# Patient Record
Sex: Female | Born: 1995 | Race: White | Hispanic: No | Marital: Married | Smoking: Never smoker
Health system: Southern US, Community
[De-identification: ages and names within clinical notes are randomized; demographics above are authoritative.]

---
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
## 2019-10-05 ENCOUNTER — Other Ambulatory Visit: Payer: Self-pay

## 2019-10-05 ENCOUNTER — Encounter: Payer: Self-pay | Admitting: Physician Assistant

## 2019-10-05 ENCOUNTER — Ambulatory Visit: Payer: BC Managed Care – PPO | Admitting: Physician Assistant

## 2019-10-05 DIAGNOSIS — L720 Epidermal cyst: Secondary | ICD-10-CM | POA: Diagnosis not present

## 2019-10-05 MED ORDER — SULFAMETHOXAZOLE-TRIMETHOPRIM 800-160 MG PO TABS: 1.0000 | ORAL_TABLET | Freq: Two times a day (BID) | ORAL | 0 refills | Status: AC

## 2019-10-05 NOTE — Progress Notes (Signed)
Noticed cyst under skin for 2 weeks but it has only been swollen for 3 days.

## 2019-10-05 NOTE — Progress Notes (Signed)
   Follow up Visit  Subjective  Erika Waters is a 24 y.o. female who presents for the following: Skin Problem (Bump on nose/near eye. Very painful and swollen. Has gone down some but has been there for 2 week..Treatment aczone had some leftover from previous visit. ). She could feel it under the skin for the past 2 weeks but it only became sore in the past 3 days.   Objective  Well appearing patient in no apparent distress; mood and affect are within normal limits.  Face examined. Relevant physical exam findings are noted in the Assessment and Plan.   Objective  Left Malar Cheek: Inflamed nodule left side of nose near medial canthal area  Assessment & Plan  Epidermal cyst Left Malar Cheek  Discussed possible need to lance or inject this area but we will see what antibiotics an do to calm it first. She is to call or send a message on Monday with a progress report.   sulfamethoxazole-trimethoprim (BACTRIM DS) 800-160 MG tablet - Left Malar Cheek

## 2019-10-05 NOTE — Patient Instructions (Signed)
Call with report on Monday.

## 2019-12-14 ENCOUNTER — Ambulatory Visit: Payer: BC Managed Care – PPO | Admitting: Dermatology

## 2020-01-04 ENCOUNTER — Other Ambulatory Visit: Payer: Self-pay

## 2020-01-04 ENCOUNTER — Ambulatory Visit (INDEPENDENT_AMBULATORY_CARE_PROVIDER_SITE_OTHER): Payer: BC Managed Care – PPO | Admitting: Dermatology

## 2020-01-04 ENCOUNTER — Encounter: Payer: Self-pay | Admitting: Dermatology

## 2020-01-04 DIAGNOSIS — L729 Follicular cyst of the skin and subcutaneous tissue, unspecified: Secondary | ICD-10-CM | POA: Diagnosis not present

## 2020-01-04 DIAGNOSIS — Z1283 Encounter for screening for malignant neoplasm of skin: Secondary | ICD-10-CM

## 2020-01-08 ENCOUNTER — Encounter: Payer: Self-pay | Admitting: Dermatology

## 2020-01-08 NOTE — Progress Notes (Signed)
   Follow-Up Visit   Subjective  Erika Waters is a 24 y.o. female who presents for the following: Annual Exam (Face between eyes ? Cyst- going away now).  Cyst Location: Mid brow area forehead Duration:  Quality:  Associated Signs/Symptoms: Modifying Factors:  Severity:  Timing: Context:   Objective  Well appearing patient in no apparent distress; mood and affect are within normal limits. Objective  Chest - Medial [facility]): Sun exposed areas plus upper chest and back examined; no atypical moles, melanoma, or nonmole skin cancer  Objective  Left Root of Nose: Subtle fibrotic 8 mm fullness left root of the nose.  History of 2 bouts of inflammation.   All sun exposed areas plus back examined.  Plus upper chest   Assessment & Plan    Screening exam for skin cancer Chest - Medial (Center)  UV protection.  Self examine her skin twice annually.  Cyst of skin Left Root of Nose  Although I do agree that there is probably a benign cyst under the skin, I believe excision may be technically rather difficult.  If she insists on pursuing removal, I suggested she consult a Engineer, petroleum.     I, Janalyn Harder, MD, have reviewed all documentation for this visit.  The documentation on 01/08/20 for the exam, diagnosis, procedures, and orders are all accurate and complete.
